# Patient Record
Sex: Female | Born: 1960 | Race: White | Hispanic: No | Marital: Single
Health system: Southern US, Community
[De-identification: ages and names within clinical notes are randomized; demographics above are authoritative.]

---

## 2005-02-22 ENCOUNTER — Ambulatory Visit: Payer: Self-pay | Admitting: Family Medicine

## 2006-09-22 ENCOUNTER — Ambulatory Visit (HOSPITAL_COMMUNITY): Admission: RE | Admit: 2006-09-22 | Discharge: 2006-09-22 | Payer: Self-pay | Admitting: Orthopedic Surgery

## 2006-10-10 ENCOUNTER — Ambulatory Visit (HOSPITAL_COMMUNITY): Admission: RE | Admit: 2006-10-10 | Discharge: 2006-10-10 | Payer: Self-pay | Admitting: Orthopedic Surgery

## 2008-06-21 ENCOUNTER — Encounter: Payer: Self-pay | Admitting: Endocrinology

## 2008-07-04 ENCOUNTER — Ambulatory Visit: Payer: Self-pay | Admitting: Endocrinology

## 2008-07-04 DIAGNOSIS — Z9189 Other specified personal risk factors, not elsewhere classified: Secondary | ICD-10-CM | POA: Insufficient documentation

## 2008-07-04 DIAGNOSIS — F3289 Other specified depressive episodes: Secondary | ICD-10-CM | POA: Insufficient documentation

## 2008-07-04 DIAGNOSIS — F329 Major depressive disorder, single episode, unspecified: Secondary | ICD-10-CM | POA: Insufficient documentation

## 2008-07-04 DIAGNOSIS — D649 Anemia, unspecified: Secondary | ICD-10-CM | POA: Insufficient documentation

## 2008-07-04 DIAGNOSIS — J309 Allergic rhinitis, unspecified: Secondary | ICD-10-CM | POA: Insufficient documentation

## 2008-07-04 DIAGNOSIS — I10 Essential (primary) hypertension: Secondary | ICD-10-CM | POA: Insufficient documentation

## 2008-07-04 DIAGNOSIS — Z87448 Personal history of other diseases of urinary system: Secondary | ICD-10-CM | POA: Insufficient documentation

## 2008-07-18 ENCOUNTER — Encounter (HOSPITAL_COMMUNITY): Admission: RE | Admit: 2008-07-18 | Discharge: 2008-09-22 | Payer: Self-pay | Admitting: Endocrinology

## 2008-07-26 ENCOUNTER — Encounter: Admission: RE | Admit: 2008-07-26 | Discharge: 2008-07-26 | Payer: Self-pay | Admitting: Endocrinology

## 2008-08-15 ENCOUNTER — Encounter (HOSPITAL_COMMUNITY): Admission: RE | Admit: 2008-08-15 | Discharge: 2008-09-20 | Payer: Self-pay | Admitting: Endocrinology

## 2008-10-17 ENCOUNTER — Ambulatory Visit: Payer: Self-pay | Admitting: Endocrinology

## 2008-10-17 DIAGNOSIS — E890 Postprocedural hypothyroidism: Secondary | ICD-10-CM | POA: Insufficient documentation

## 2008-10-17 DIAGNOSIS — E042 Nontoxic multinodular goiter: Secondary | ICD-10-CM | POA: Insufficient documentation

## 2008-10-19 ENCOUNTER — Telehealth (INDEPENDENT_AMBULATORY_CARE_PROVIDER_SITE_OTHER): Payer: Self-pay | Admitting: *Deleted

## 2008-11-15 ENCOUNTER — Ambulatory Visit: Payer: Self-pay | Admitting: Endocrinology

## 2008-11-15 LAB — CONVERTED CEMR LAB: TSH: 16.91 microintl units/mL — ABNORMAL HIGH (ref 0.35–5.50)

## 2009-01-09 ENCOUNTER — Ambulatory Visit: Payer: Self-pay | Admitting: Endocrinology

## 2009-01-09 LAB — CONVERTED CEMR LAB: TSH: 9.22 microintl units/mL — ABNORMAL HIGH (ref 0.35–5.50)

## 2009-04-03 ENCOUNTER — Ambulatory Visit: Payer: Self-pay | Admitting: Endocrinology

## 2009-04-06 ENCOUNTER — Telehealth: Payer: Self-pay | Admitting: Endocrinology

## 2009-04-10 ENCOUNTER — Encounter: Admission: RE | Admit: 2009-04-10 | Discharge: 2009-04-10 | Payer: Self-pay | Admitting: Endocrinology

## 2009-06-23 LAB — CONVERTED CEMR LAB

## 2009-10-16 ENCOUNTER — Ambulatory Visit: Payer: Self-pay | Admitting: Endocrinology

## 2010-04-13 IMAGING — US US SOFT TISSUE HEAD/NECK
1 series · 14 of 24 positions shown · non-contrast
Comparison: 07/19/2008 nuclear medicine thyroid scan.

CLINICAL DATA: Hyperthyroidism.  Possible multinodular goiter noted
on nuclear medicine thyroid scan.

THYROID ULTRASOUND
TECHNIQUE: Ultrasound examination of the thyroid gland and
adjacent soft tissues was performed.

[Series 1: us soft tissue head/neck · 0.08mm/px · 14 of 24 slices shown]
[im 1/24]
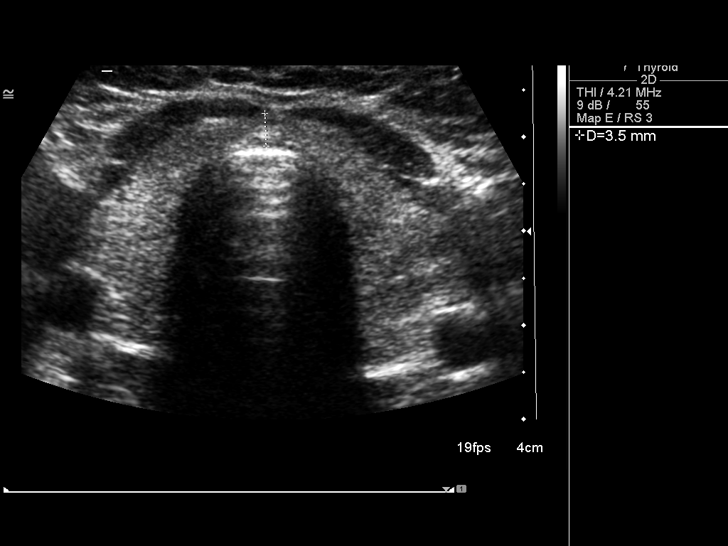
[im 3/24]
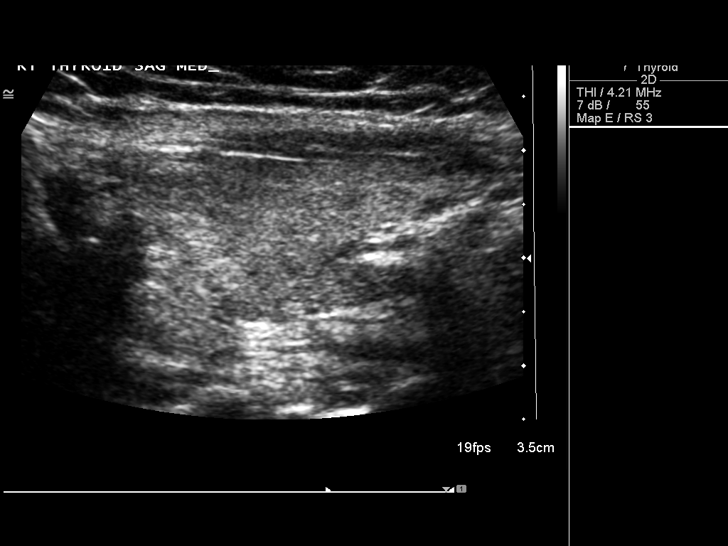
[im 5/24]
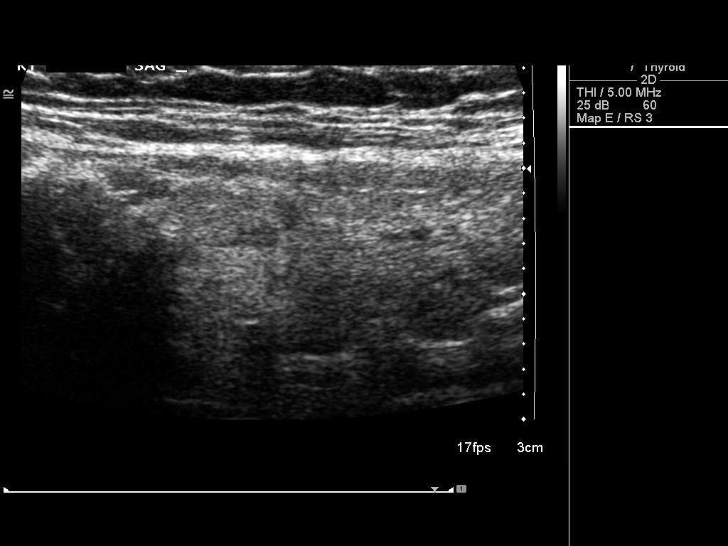
[im 7/24]
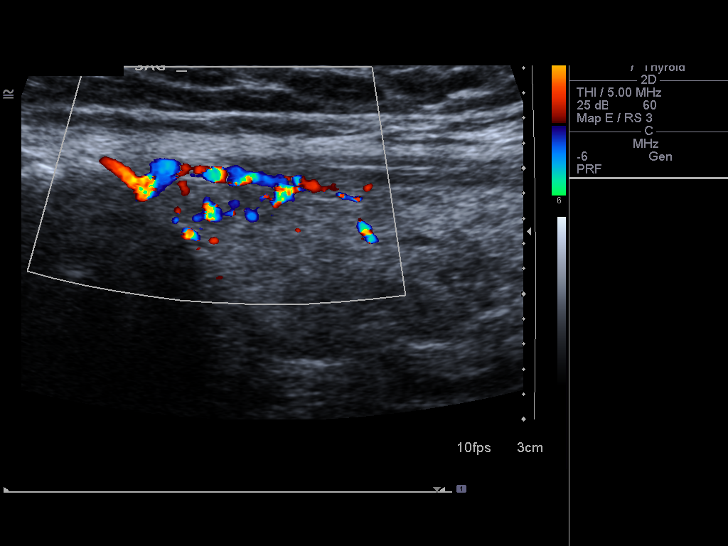
[im 8/24]
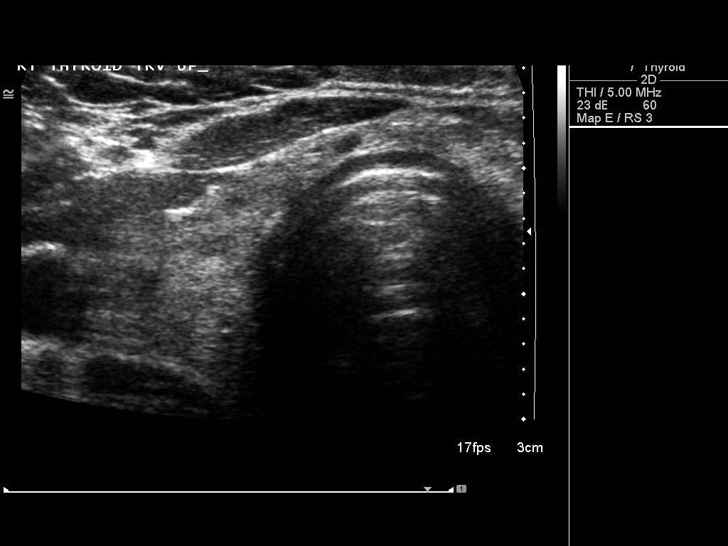
[im 10/24]
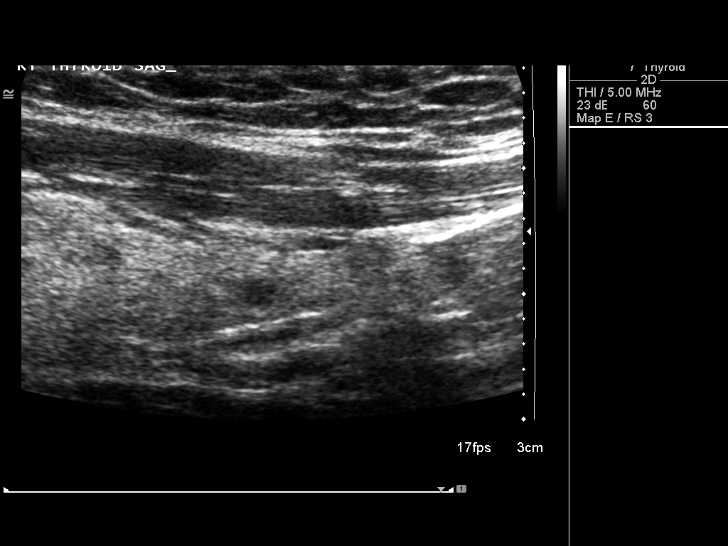
[im 12/24]
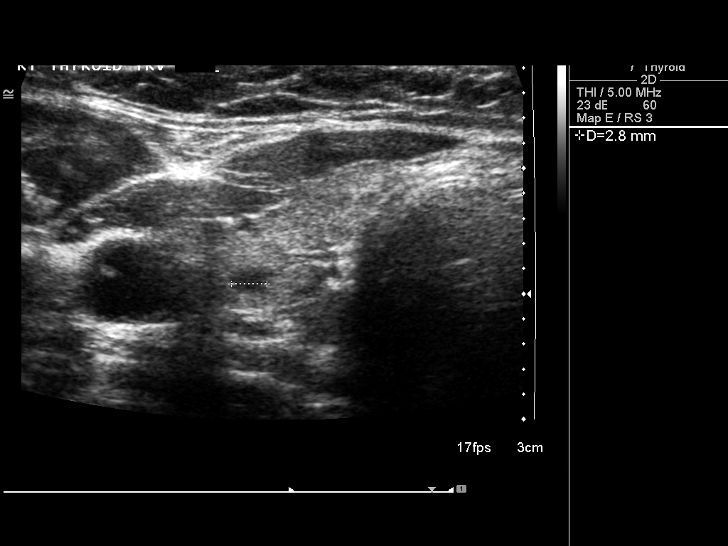
[im 13/24]
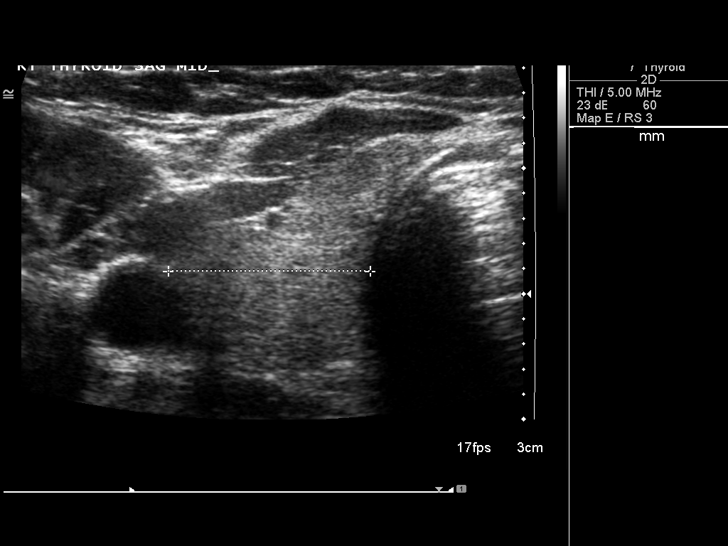
[im 15/24]
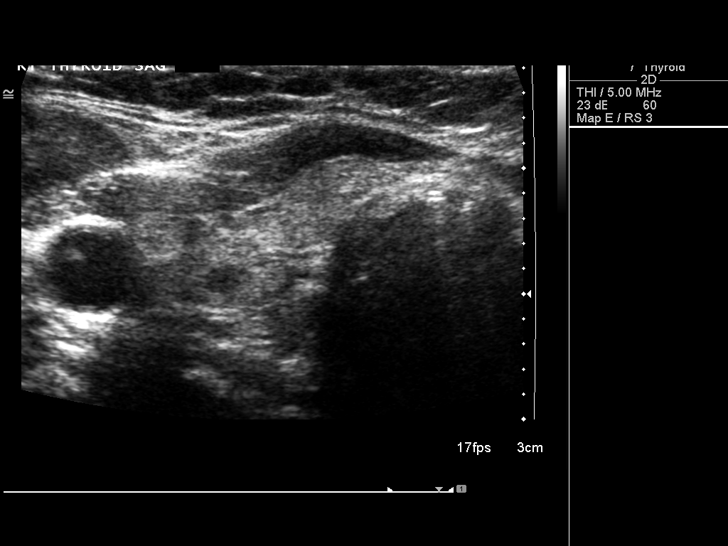
[im 17/24]
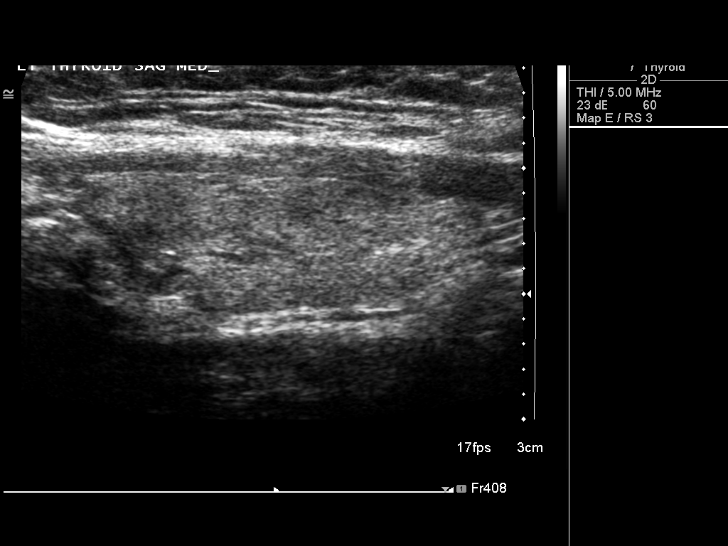
[im 19/24]
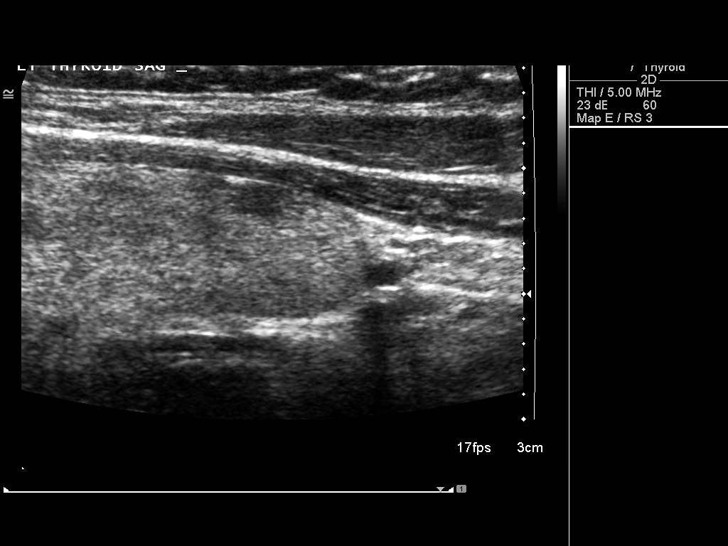
[im 20/24]
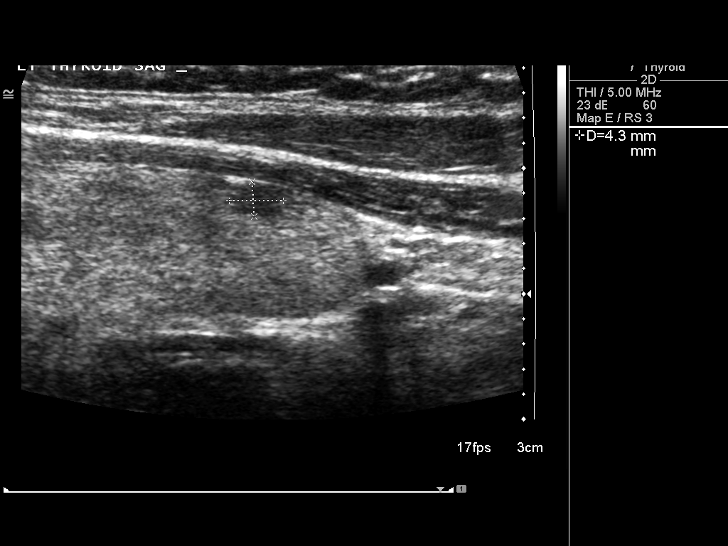
[im 22/24]
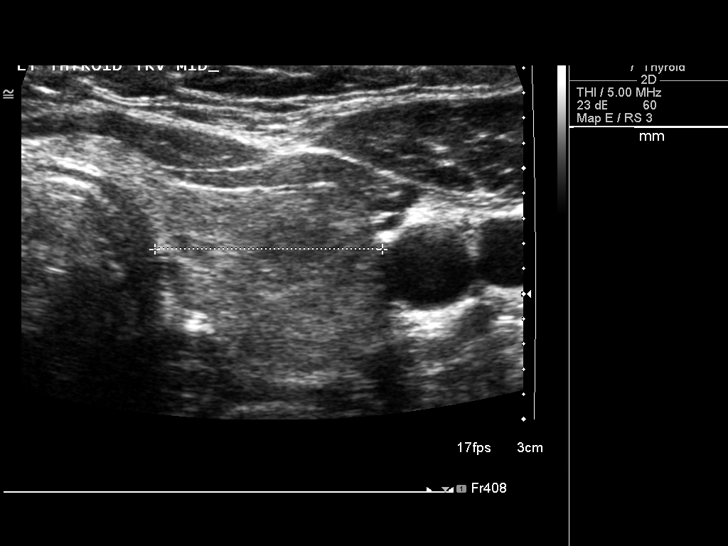
[im 24/24]
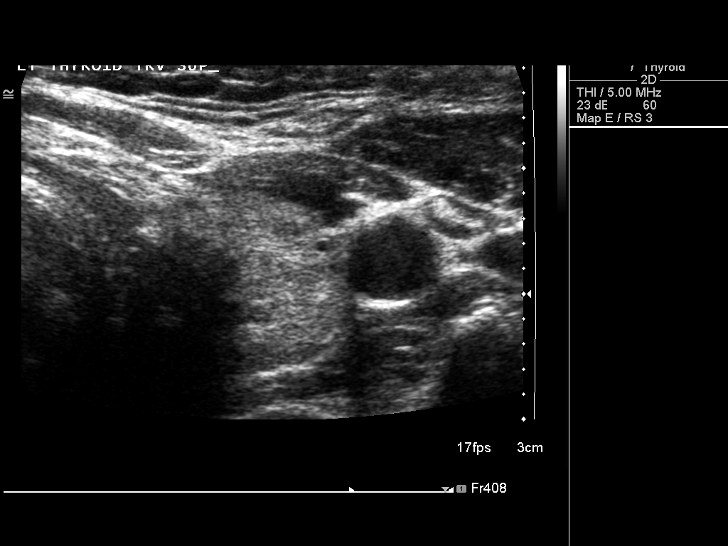

[14 of 24 positions shown; findings below may reference images not displayed]

FINDINGS: The right lobe of thyroid measures 4.2 x 1.4 x 1.6 cm in
size and the left lobe 4.0 x 1.6 x 1.8 cm in size.  There is a
x 0.5 x 1.0 cm in size solid nodule associated with the medial
upper pole of the right lobe of the thyroid.  Two small nodules are
seen within the lower poles of the right and left lobes of the
thyroid.  On the right, the nodule measures 0.3 x 0.3 x 0.3 cm in
size and on the left, 0.4 x 0.3 x 0.3 cm in size.  The thyroid
echotexture is inhomogeneous.
IMPRESSION: Multinodular gland as discussed above.  The largest nodule is
associated with the medial portion of the upper pole of the right
lobe thyroid and measures 1.1 x 0.5 of 1.0 cm in size.

## 2010-10-14 ENCOUNTER — Encounter: Payer: Self-pay | Admitting: Endocrinology

## 2010-10-23 NOTE — Assessment & Plan Note (Signed)
Summary: 6 MTH FU  $50  STC--RS BUMP/PHONE--MAILED APPT CARD PER PT/MO...   Vital Signs:  Patient profile:   50 year old female Height:      69 inches (175.26 cm) Weight:      253 pounds (115.00 kg) O2 Sat:      98 % on Room air Temp:     96.2 degrees F (35.67 degrees C) oral Pulse rate:   74 / minute BP sitting:   142 / 98  (left arm) Cuff size:   large  Vitals Entered By: Josph Macho CMA (October 16, 2009 8:21 AM)  O2 Flow:  Room air CC: 6 month follow up/ CF Is Patient Diabetic? No   Referring Provider:  schultz Primary Provider:  schultz  CC:  6 month follow up/ CF.  History of Present Illness: pt is now more than 1 year s/p i-131 rx for hyperthyroidism. her levothyroxine was increased to 200 micrograms/day, due to an abnormal tsh in dec 2010. she is discouraged by weight gain over the past few months. she does not notice her thyroid being enlarged.  Current Medications (verified): 1)  Levothyroxine Sodium 200 Mcg Tabs (Levothyroxine Sodium) .Marland Kitchen.. 1 Qd  Allergies (verified): No Known Drug Allergies  Past History:  Past Medical History: Last updated: 07/04/2008 Allergic rhinitis Anemia-NOS Depression Hypertension Hyperthyroidism  Review of Systems       The patient complains of weight gain.    Physical Exam  General:  obese.  no distress  Neck:  i do not appreciate a goiter or thyroid nodule. Additional Exam:  thyroid US (2010):  improved. 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 situational  Problem # 3:  GOITER, MULTINODULAR (ICD-241.1)  Medications Added to Medication List This Visit: 1)  Levothyroxine Sodium 200 Mcg Tabs (levothyroxine Sodium)  .Marland Kitchen.. 1 qd 2)  Levothyroxine Sodium 175 Mcg Tabs (Levothyroxine sodium) .Marland Kitchen.. 1 qd  Other Orders: TLB-TSH (Thyroid Stimulating Hormone)  (84443-TSH) Est. Patient Level IV (29528)  Patient Instructions: 1)  pending the test results, please continue the same medications for now (levothyroxine 200 mcg once daily). 2)  Please returrn here as needed. 3)  tests are being ordered for you today.  a few days after the test(s), please call [phone] to hear your test results.  this is very important to do because the results may change the instructions you see here. 4)  please have a bp recheck at [facility] 5)  (update: i left message on phone-tree:  reduce levothyroxine to 175 micrograms/day) 6)  we'll follow the thyroid ultraound periodically. Prescriptions: LEVOTHYROXINE SODIUM 175 MCG TABS (LEVOTHYROXINE SODIUM) 1 qd  #30 x 5   Entered and Authorized by:   Minus Breeding MD   Signed by:   Minus Breeding MD on 10/18/2009   Method used:   Electronically to        [facility]  [id number] (retail)       [address]       [place], [place]  [address]       Ph: [phone] or [phone]       Fax: [phone]   RxID:   [id number]   Preventive Care Screening  Mammogram:    Date:  07/24/2009    Results:  historical  Pap Smear:    Date:  06/23/2009    Results:  historical

## 2010-12-27 IMAGING — US US SOFT TISSUE HEAD/NECK
1 series · 14 of 25 positions shown · non-contrast
Comparison: 07/26/2008

CLINICAL DATA: Multinodular goiter

THYROID ULTRASOUND
TECHNIQUE: Ultrasound examination of the thyroid gland and
adjacent soft tissues was performed.

[Series 1: us soft tissue head/neck · 0.05mm/px · 14 of 34 slices shown]
[im 1/34]
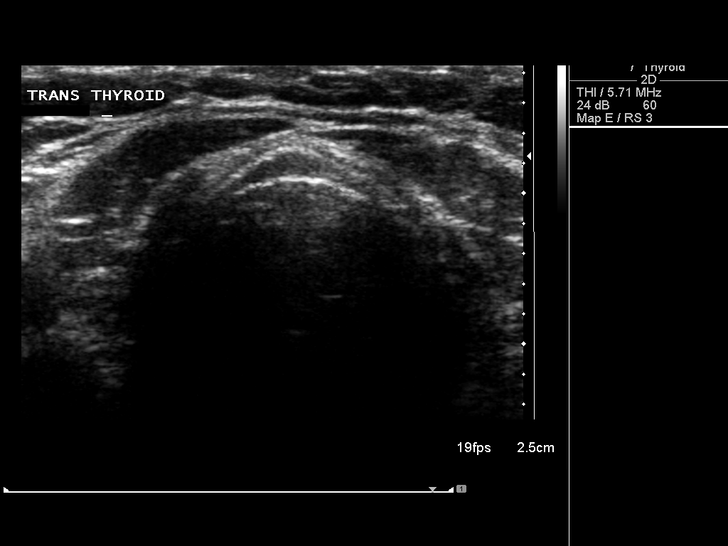
[im 3/34]
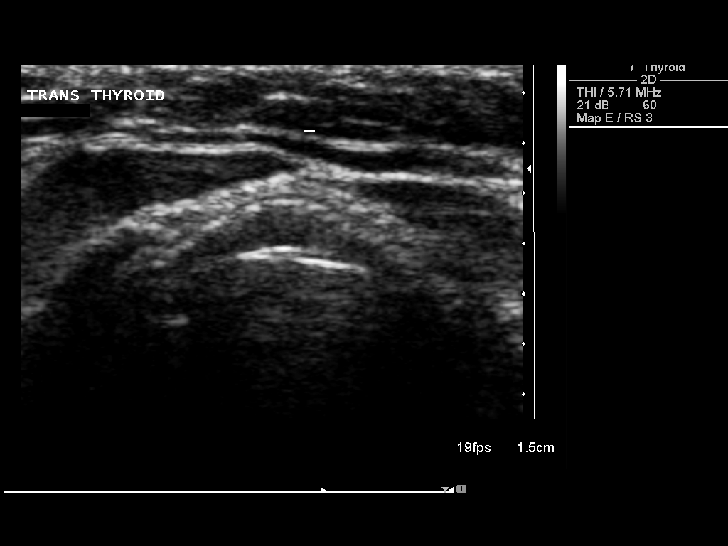
[im 6/34]
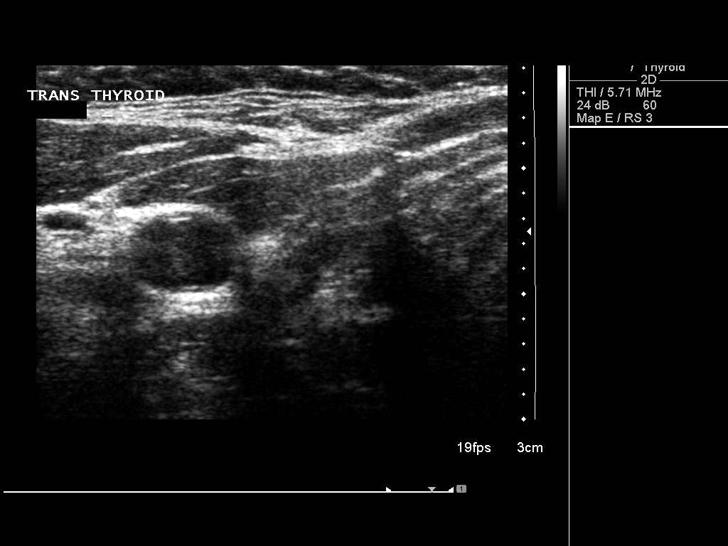
[im 9/34]
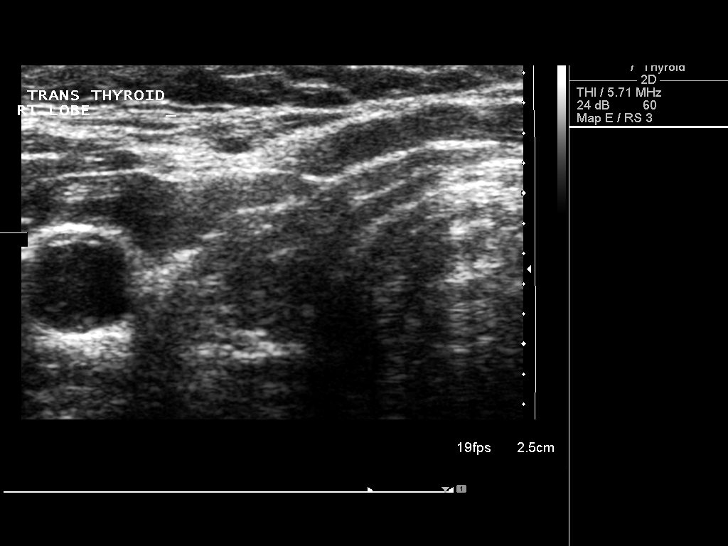
[im 12/34]
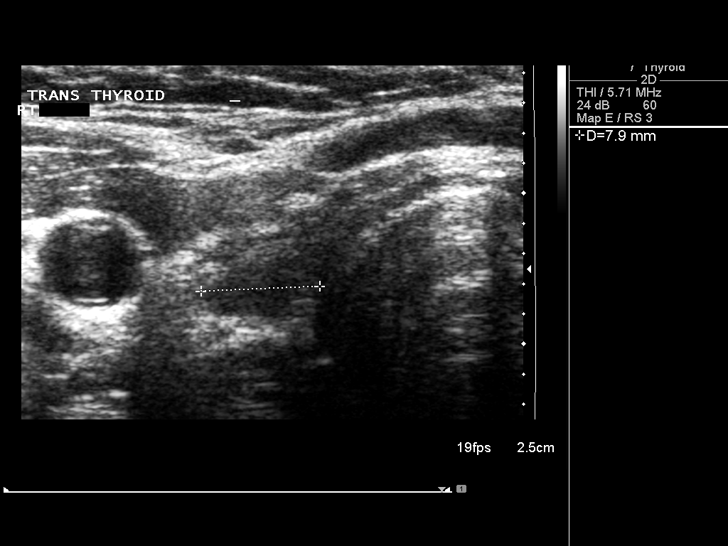
[im 13/34]
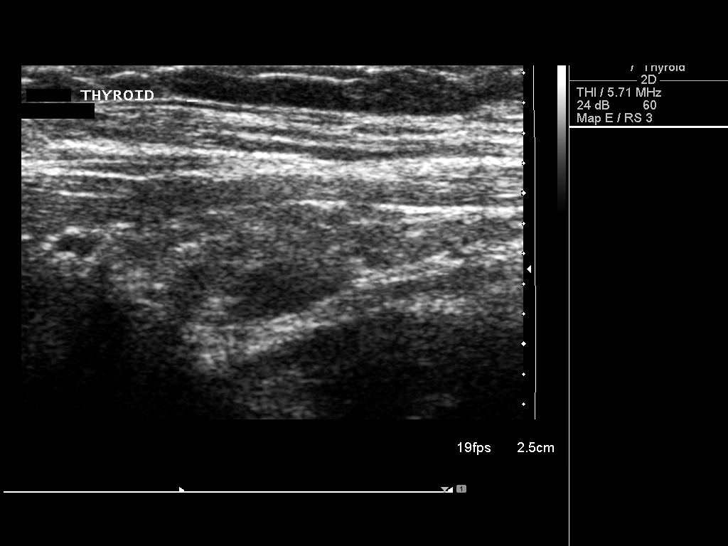
[im 16/34]
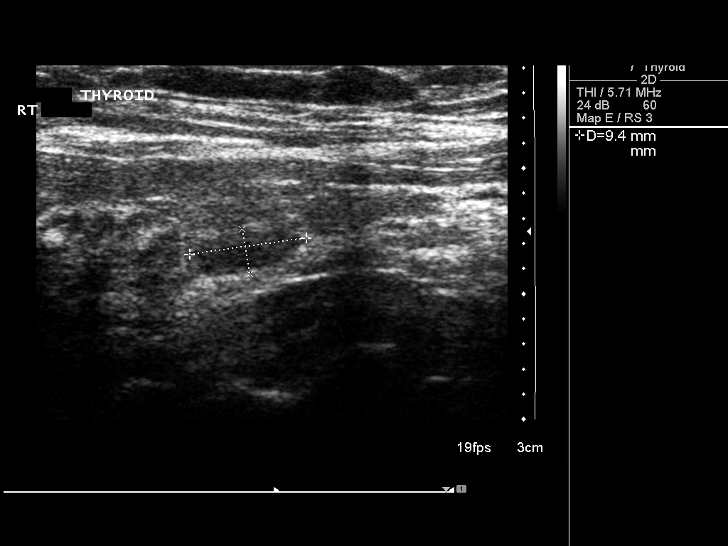
[im 18/34]
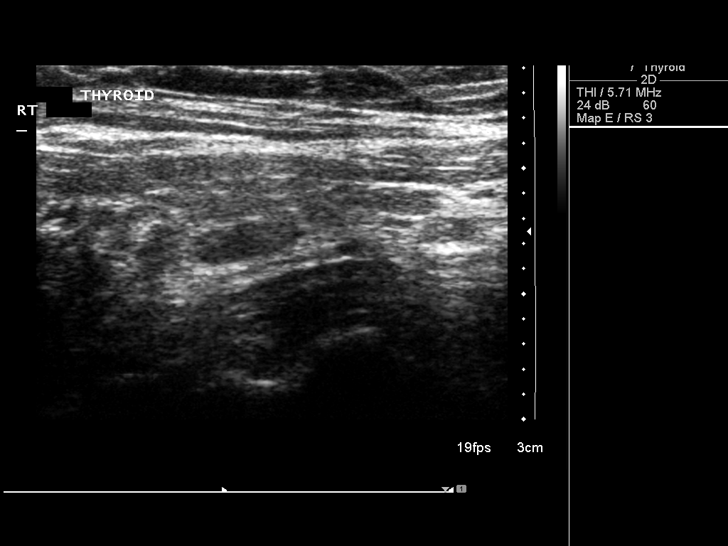
[im 21/34]
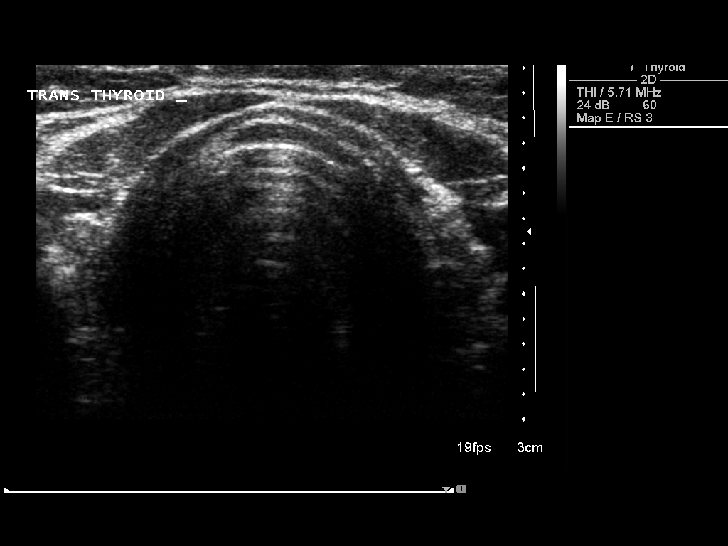
[im 23/34]
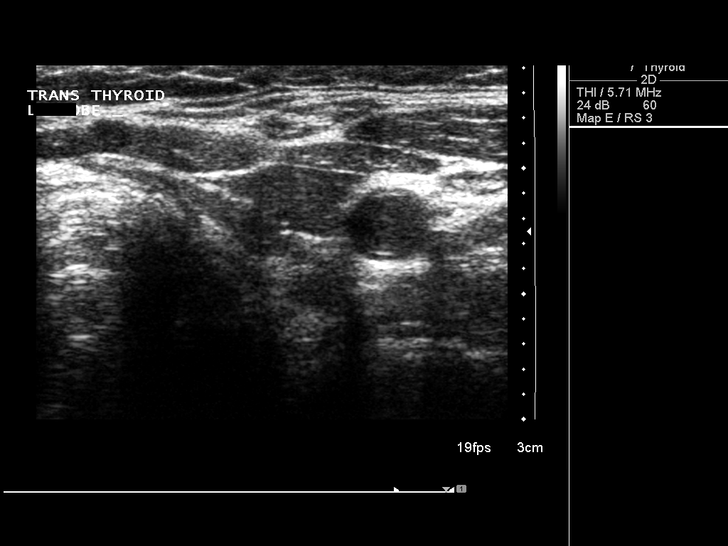
[im 25/34]
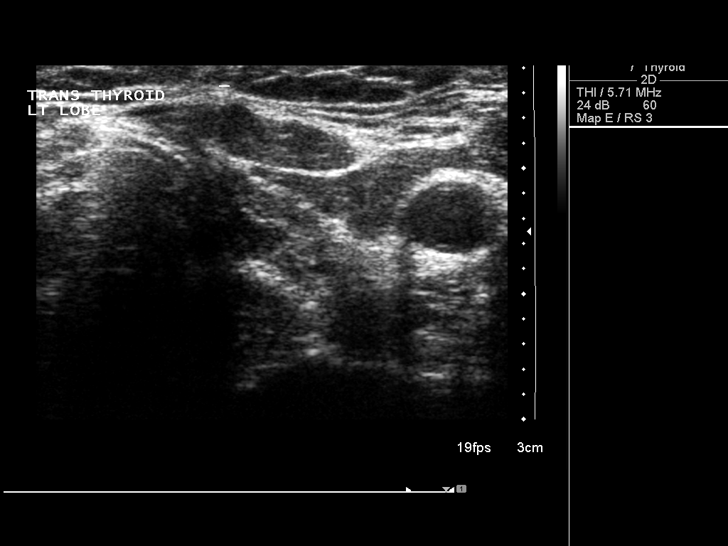
[im 28/34]
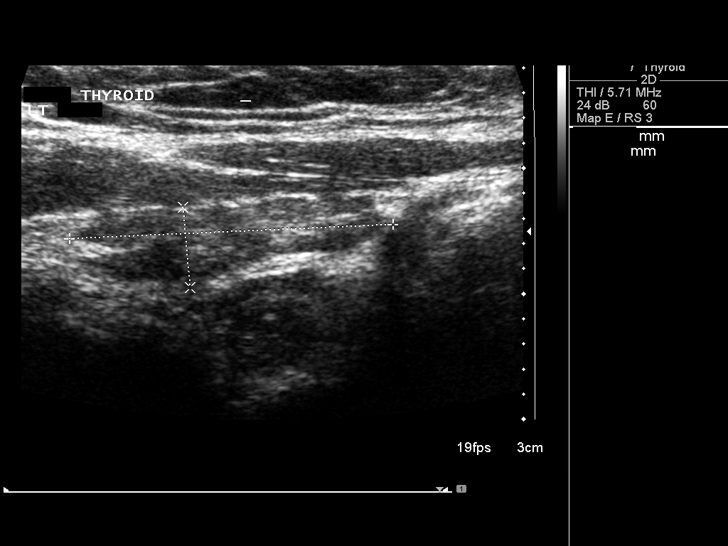
[im 31/34]
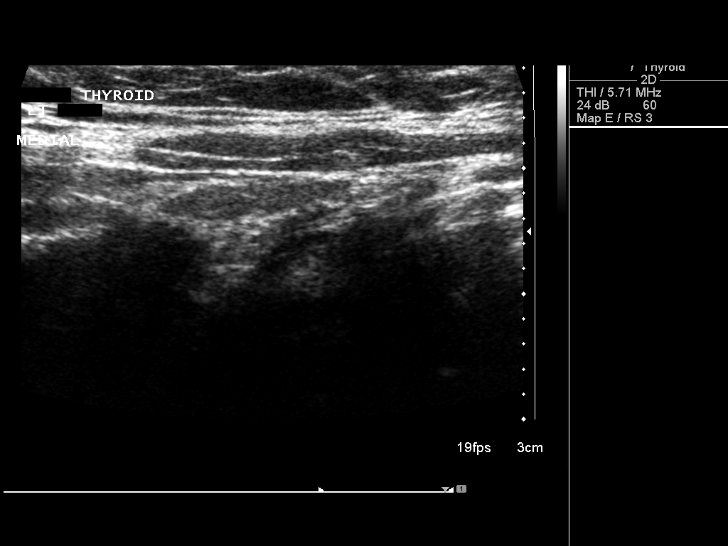
[im 34/34]
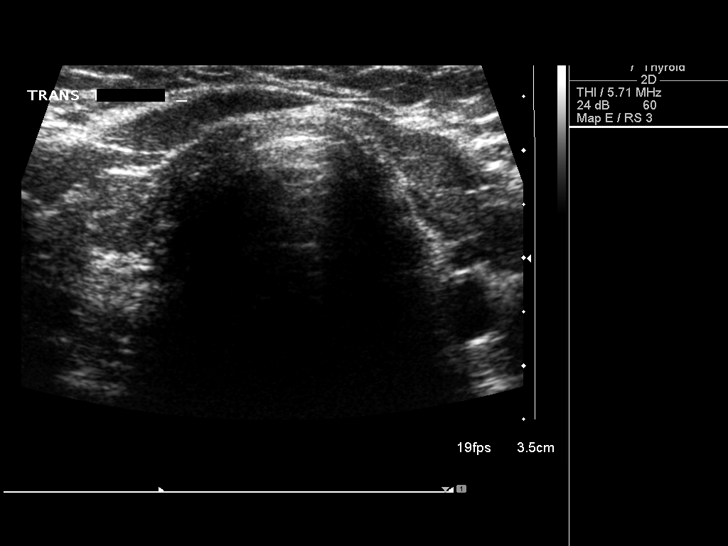

[14 of 25 positions shown; findings below may reference images not displayed]

FINDINGS: Right thyroid lobe 2.9 cm length by 0.9 cm AP by 1.1 cm transverse.
Left thyroid lobe 2.6 cm length by 0.6 cm AP by 0.9 cm transverse.
Thyroid isthmus 1.4 mm thick.
Diffusely inhomogeneous thyroid echogenicity bilaterally.
Single discrete nodule identified right lobe 9 x 4 x 8 mm,
hypoechoic.
No other definite thyroid nodules are detected.
Both thyroid lobes have decreased in size since previous exam.
In addition, additional small nodules seen in both thyroid lobes on
previous study are no longer identified.
These may be the result of interval radioactive iodine therapy for
hyperthyroidism.
IMPRESSION: Single tiny 9 mm right thyroid nodule, with decrease in number and
size of thyroid nodules as well as overall decrease in size of
thyroid gland since previous study, suspect related to interval [place]
131 therapy.

## 2016-07-08 DIAGNOSIS — Z0000 Encounter for general adult medical examination without abnormal findings: Secondary | ICD-10-CM | POA: Diagnosis not present

## 2016-07-08 DIAGNOSIS — E890 Postprocedural hypothyroidism: Secondary | ICD-10-CM | POA: Diagnosis not present

## 2016-07-08 DIAGNOSIS — Z1389 Encounter for screening for other disorder: Secondary | ICD-10-CM | POA: Diagnosis not present

## 2016-07-08 DIAGNOSIS — E785 Hyperlipidemia, unspecified: Secondary | ICD-10-CM | POA: Diagnosis not present

## 2016-08-05 DIAGNOSIS — Z1231 Encounter for screening mammogram for malignant neoplasm of breast: Secondary | ICD-10-CM | POA: Diagnosis not present

## 2016-09-10 DIAGNOSIS — Z1211 Encounter for screening for malignant neoplasm of colon: Secondary | ICD-10-CM | POA: Diagnosis not present

## 2016-09-10 DIAGNOSIS — Z86010 Personal history of colonic polyps: Secondary | ICD-10-CM | POA: Diagnosis not present

## 2016-10-23 DIAGNOSIS — Z79899 Other long term (current) drug therapy: Secondary | ICD-10-CM | POA: Diagnosis not present

## 2016-10-23 DIAGNOSIS — K634 Enteroptosis: Secondary | ICD-10-CM | POA: Diagnosis not present

## 2016-10-23 DIAGNOSIS — Z86010 Personal history of colonic polyps: Secondary | ICD-10-CM | POA: Diagnosis not present

## 2016-10-23 DIAGNOSIS — Z1211 Encounter for screening for malignant neoplasm of colon: Secondary | ICD-10-CM | POA: Diagnosis not present

## 2016-10-23 DIAGNOSIS — Z87891 Personal history of nicotine dependence: Secondary | ICD-10-CM | POA: Diagnosis not present

## 2016-10-23 DIAGNOSIS — K635 Polyp of colon: Secondary | ICD-10-CM | POA: Diagnosis not present

## 2016-10-23 DIAGNOSIS — E785 Hyperlipidemia, unspecified: Secondary | ICD-10-CM | POA: Diagnosis not present

## 2016-10-23 DIAGNOSIS — E559 Vitamin D deficiency, unspecified: Secondary | ICD-10-CM | POA: Diagnosis not present

## 2016-10-23 DIAGNOSIS — D124 Benign neoplasm of descending colon: Secondary | ICD-10-CM | POA: Diagnosis not present

## 2016-10-23 DIAGNOSIS — E039 Hypothyroidism, unspecified: Secondary | ICD-10-CM | POA: Diagnosis not present

## 2016-10-23 DIAGNOSIS — K5730 Diverticulosis of large intestine without perforation or abscess without bleeding: Secondary | ICD-10-CM | POA: Diagnosis not present

## 2017-01-07 DIAGNOSIS — E559 Vitamin D deficiency, unspecified: Secondary | ICD-10-CM | POA: Diagnosis not present

## 2017-01-07 DIAGNOSIS — E890 Postprocedural hypothyroidism: Secondary | ICD-10-CM | POA: Diagnosis not present

## 2017-01-07 DIAGNOSIS — E785 Hyperlipidemia, unspecified: Secondary | ICD-10-CM | POA: Diagnosis not present

## 2017-02-11 DIAGNOSIS — M9903 Segmental and somatic dysfunction of lumbar region: Secondary | ICD-10-CM | POA: Diagnosis not present

## 2017-02-11 DIAGNOSIS — M9905 Segmental and somatic dysfunction of pelvic region: Secondary | ICD-10-CM | POA: Diagnosis not present

## 2017-02-11 DIAGNOSIS — M545 Low back pain: Secondary | ICD-10-CM | POA: Diagnosis not present

## 2017-02-11 DIAGNOSIS — M4316 Spondylolisthesis, lumbar region: Secondary | ICD-10-CM | POA: Diagnosis not present

## 2017-02-18 DIAGNOSIS — M9905 Segmental and somatic dysfunction of pelvic region: Secondary | ICD-10-CM | POA: Diagnosis not present

## 2017-02-18 DIAGNOSIS — M9903 Segmental and somatic dysfunction of lumbar region: Secondary | ICD-10-CM | POA: Diagnosis not present

## 2017-02-18 DIAGNOSIS — M4316 Spondylolisthesis, lumbar region: Secondary | ICD-10-CM | POA: Diagnosis not present

## 2017-02-18 DIAGNOSIS — M545 Low back pain: Secondary | ICD-10-CM | POA: Diagnosis not present

## 2017-02-19 DIAGNOSIS — M9905 Segmental and somatic dysfunction of pelvic region: Secondary | ICD-10-CM | POA: Diagnosis not present

## 2017-02-19 DIAGNOSIS — M4316 Spondylolisthesis, lumbar region: Secondary | ICD-10-CM | POA: Diagnosis not present

## 2017-02-19 DIAGNOSIS — M9903 Segmental and somatic dysfunction of lumbar region: Secondary | ICD-10-CM | POA: Diagnosis not present

## 2017-02-19 DIAGNOSIS — M545 Low back pain: Secondary | ICD-10-CM | POA: Diagnosis not present

## 2017-02-24 DIAGNOSIS — M9905 Segmental and somatic dysfunction of pelvic region: Secondary | ICD-10-CM | POA: Diagnosis not present

## 2017-02-24 DIAGNOSIS — M545 Low back pain: Secondary | ICD-10-CM | POA: Diagnosis not present

## 2017-02-24 DIAGNOSIS — M9903 Segmental and somatic dysfunction of lumbar region: Secondary | ICD-10-CM | POA: Diagnosis not present

## 2017-02-24 DIAGNOSIS — M4316 Spondylolisthesis, lumbar region: Secondary | ICD-10-CM | POA: Diagnosis not present

## 2017-02-25 DIAGNOSIS — M9905 Segmental and somatic dysfunction of pelvic region: Secondary | ICD-10-CM | POA: Diagnosis not present

## 2017-02-25 DIAGNOSIS — M4316 Spondylolisthesis, lumbar region: Secondary | ICD-10-CM | POA: Diagnosis not present

## 2017-02-25 DIAGNOSIS — M9903 Segmental and somatic dysfunction of lumbar region: Secondary | ICD-10-CM | POA: Diagnosis not present

## 2017-02-25 DIAGNOSIS — M545 Low back pain: Secondary | ICD-10-CM | POA: Diagnosis not present

## 2017-02-26 DIAGNOSIS — M545 Low back pain: Secondary | ICD-10-CM | POA: Diagnosis not present

## 2017-02-26 DIAGNOSIS — M4316 Spondylolisthesis, lumbar region: Secondary | ICD-10-CM | POA: Diagnosis not present

## 2017-02-26 DIAGNOSIS — M9903 Segmental and somatic dysfunction of lumbar region: Secondary | ICD-10-CM | POA: Diagnosis not present

## 2017-02-26 DIAGNOSIS — M9905 Segmental and somatic dysfunction of pelvic region: Secondary | ICD-10-CM | POA: Diagnosis not present

## 2017-03-03 DIAGNOSIS — M4316 Spondylolisthesis, lumbar region: Secondary | ICD-10-CM | POA: Diagnosis not present

## 2017-03-03 DIAGNOSIS — M9903 Segmental and somatic dysfunction of lumbar region: Secondary | ICD-10-CM | POA: Diagnosis not present

## 2017-03-03 DIAGNOSIS — M9905 Segmental and somatic dysfunction of pelvic region: Secondary | ICD-10-CM | POA: Diagnosis not present

## 2017-03-03 DIAGNOSIS — M545 Low back pain: Secondary | ICD-10-CM | POA: Diagnosis not present

## 2017-03-04 DIAGNOSIS — M545 Low back pain: Secondary | ICD-10-CM | POA: Diagnosis not present

## 2017-03-04 DIAGNOSIS — M9903 Segmental and somatic dysfunction of lumbar region: Secondary | ICD-10-CM | POA: Diagnosis not present

## 2017-03-04 DIAGNOSIS — M4316 Spondylolisthesis, lumbar region: Secondary | ICD-10-CM | POA: Diagnosis not present

## 2017-03-04 DIAGNOSIS — M9905 Segmental and somatic dysfunction of pelvic region: Secondary | ICD-10-CM | POA: Diagnosis not present

## 2017-03-05 DIAGNOSIS — M4316 Spondylolisthesis, lumbar region: Secondary | ICD-10-CM | POA: Diagnosis not present

## 2017-03-05 DIAGNOSIS — M545 Low back pain: Secondary | ICD-10-CM | POA: Diagnosis not present

## 2017-03-05 DIAGNOSIS — M9905 Segmental and somatic dysfunction of pelvic region: Secondary | ICD-10-CM | POA: Diagnosis not present

## 2017-03-05 DIAGNOSIS — M9903 Segmental and somatic dysfunction of lumbar region: Secondary | ICD-10-CM | POA: Diagnosis not present

## 2017-03-10 DIAGNOSIS — M545 Low back pain: Secondary | ICD-10-CM | POA: Diagnosis not present

## 2017-03-10 DIAGNOSIS — M9903 Segmental and somatic dysfunction of lumbar region: Secondary | ICD-10-CM | POA: Diagnosis not present

## 2017-03-10 DIAGNOSIS — M9905 Segmental and somatic dysfunction of pelvic region: Secondary | ICD-10-CM | POA: Diagnosis not present

## 2017-03-10 DIAGNOSIS — M4316 Spondylolisthesis, lumbar region: Secondary | ICD-10-CM | POA: Diagnosis not present

## 2017-03-11 DIAGNOSIS — M4316 Spondylolisthesis, lumbar region: Secondary | ICD-10-CM | POA: Diagnosis not present

## 2017-03-11 DIAGNOSIS — M9903 Segmental and somatic dysfunction of lumbar region: Secondary | ICD-10-CM | POA: Diagnosis not present

## 2017-03-11 DIAGNOSIS — M545 Low back pain: Secondary | ICD-10-CM | POA: Diagnosis not present

## 2017-03-11 DIAGNOSIS — M9905 Segmental and somatic dysfunction of pelvic region: Secondary | ICD-10-CM | POA: Diagnosis not present

## 2017-03-17 DIAGNOSIS — M9905 Segmental and somatic dysfunction of pelvic region: Secondary | ICD-10-CM | POA: Diagnosis not present

## 2017-03-17 DIAGNOSIS — M9903 Segmental and somatic dysfunction of lumbar region: Secondary | ICD-10-CM | POA: Diagnosis not present

## 2017-03-17 DIAGNOSIS — M545 Low back pain: Secondary | ICD-10-CM | POA: Diagnosis not present

## 2017-03-17 DIAGNOSIS — M4316 Spondylolisthesis, lumbar region: Secondary | ICD-10-CM | POA: Diagnosis not present

## 2017-03-24 DIAGNOSIS — M545 Low back pain: Secondary | ICD-10-CM | POA: Diagnosis not present

## 2017-03-24 DIAGNOSIS — M9903 Segmental and somatic dysfunction of lumbar region: Secondary | ICD-10-CM | POA: Diagnosis not present

## 2017-03-24 DIAGNOSIS — M4316 Spondylolisthesis, lumbar region: Secondary | ICD-10-CM | POA: Diagnosis not present

## 2017-03-24 DIAGNOSIS — M9905 Segmental and somatic dysfunction of pelvic region: Secondary | ICD-10-CM | POA: Diagnosis not present

## 2017-03-31 DIAGNOSIS — M9903 Segmental and somatic dysfunction of lumbar region: Secondary | ICD-10-CM | POA: Diagnosis not present

## 2017-03-31 DIAGNOSIS — M9905 Segmental and somatic dysfunction of pelvic region: Secondary | ICD-10-CM | POA: Diagnosis not present

## 2017-03-31 DIAGNOSIS — M4316 Spondylolisthesis, lumbar region: Secondary | ICD-10-CM | POA: Diagnosis not present

## 2017-03-31 DIAGNOSIS — M545 Low back pain: Secondary | ICD-10-CM | POA: Diagnosis not present

## 2017-04-16 DIAGNOSIS — M9905 Segmental and somatic dysfunction of pelvic region: Secondary | ICD-10-CM | POA: Diagnosis not present

## 2017-04-16 DIAGNOSIS — M545 Low back pain: Secondary | ICD-10-CM | POA: Diagnosis not present

## 2017-04-16 DIAGNOSIS — M4316 Spondylolisthesis, lumbar region: Secondary | ICD-10-CM | POA: Diagnosis not present

## 2017-04-16 DIAGNOSIS — M9903 Segmental and somatic dysfunction of lumbar region: Secondary | ICD-10-CM | POA: Diagnosis not present

## 2017-05-05 DIAGNOSIS — M9902 Segmental and somatic dysfunction of thoracic region: Secondary | ICD-10-CM | POA: Diagnosis not present

## 2017-05-05 DIAGNOSIS — M9903 Segmental and somatic dysfunction of lumbar region: Secondary | ICD-10-CM | POA: Diagnosis not present

## 2017-05-05 DIAGNOSIS — M9905 Segmental and somatic dysfunction of pelvic region: Secondary | ICD-10-CM | POA: Diagnosis not present

## 2017-05-05 DIAGNOSIS — M4316 Spondylolisthesis, lumbar region: Secondary | ICD-10-CM | POA: Diagnosis not present

## 2017-07-15 DIAGNOSIS — Z1231 Encounter for screening mammogram for malignant neoplasm of breast: Secondary | ICD-10-CM | POA: Diagnosis not present

## 2017-07-15 DIAGNOSIS — Z2821 Immunization not carried out because of patient refusal: Secondary | ICD-10-CM | POA: Diagnosis not present

## 2017-07-15 DIAGNOSIS — Z0000 Encounter for general adult medical examination without abnormal findings: Secondary | ICD-10-CM | POA: Diagnosis not present

## 2017-07-15 DIAGNOSIS — E669 Obesity, unspecified: Secondary | ICD-10-CM | POA: Diagnosis not present

## 2017-07-15 DIAGNOSIS — Z1389 Encounter for screening for other disorder: Secondary | ICD-10-CM | POA: Diagnosis not present

## 2017-07-15 DIAGNOSIS — E559 Vitamin D deficiency, unspecified: Secondary | ICD-10-CM | POA: Diagnosis not present

## 2017-08-18 DIAGNOSIS — M8589 Other specified disorders of bone density and structure, multiple sites: Secondary | ICD-10-CM | POA: Diagnosis not present

## 2017-08-18 DIAGNOSIS — M85852 Other specified disorders of bone density and structure, left thigh: Secondary | ICD-10-CM | POA: Diagnosis not present

## 2017-08-18 DIAGNOSIS — Z1231 Encounter for screening mammogram for malignant neoplasm of breast: Secondary | ICD-10-CM | POA: Diagnosis not present

## 2018-03-10 DIAGNOSIS — E890 Postprocedural hypothyroidism: Secondary | ICD-10-CM | POA: Diagnosis not present

## 2018-03-10 DIAGNOSIS — E785 Hyperlipidemia, unspecified: Secondary | ICD-10-CM | POA: Diagnosis not present

## 2018-10-27 DIAGNOSIS — M4316 Spondylolisthesis, lumbar region: Secondary | ICD-10-CM | POA: Diagnosis not present

## 2018-10-27 DIAGNOSIS — M9902 Segmental and somatic dysfunction of thoracic region: Secondary | ICD-10-CM | POA: Diagnosis not present

## 2018-10-27 DIAGNOSIS — M9903 Segmental and somatic dysfunction of lumbar region: Secondary | ICD-10-CM | POA: Diagnosis not present

## 2018-10-27 DIAGNOSIS — M9905 Segmental and somatic dysfunction of pelvic region: Secondary | ICD-10-CM | POA: Diagnosis not present

## 2019-06-22 DIAGNOSIS — M65332 Trigger finger, left middle finger: Secondary | ICD-10-CM | POA: Diagnosis not present

## 2019-06-22 DIAGNOSIS — M79642 Pain in left hand: Secondary | ICD-10-CM | POA: Diagnosis not present

## 2019-06-22 DIAGNOSIS — M65342 Trigger finger, left ring finger: Secondary | ICD-10-CM | POA: Diagnosis not present

## 2019-06-28 DIAGNOSIS — Z2821 Immunization not carried out because of patient refusal: Secondary | ICD-10-CM | POA: Diagnosis not present

## 2019-06-28 DIAGNOSIS — E559 Vitamin D deficiency, unspecified: Secondary | ICD-10-CM | POA: Diagnosis not present

## 2019-06-28 DIAGNOSIS — Z0000 Encounter for general adult medical examination without abnormal findings: Secondary | ICD-10-CM | POA: Diagnosis not present

## 2019-06-28 DIAGNOSIS — E785 Hyperlipidemia, unspecified: Secondary | ICD-10-CM | POA: Diagnosis not present

## 2019-06-28 DIAGNOSIS — E669 Obesity, unspecified: Secondary | ICD-10-CM | POA: Diagnosis not present

## 2019-06-28 DIAGNOSIS — Z1331 Encounter for screening for depression: Secondary | ICD-10-CM | POA: Diagnosis not present

## 2019-07-20 DIAGNOSIS — M65332 Trigger finger, left middle finger: Secondary | ICD-10-CM | POA: Diagnosis not present

## 2019-07-20 DIAGNOSIS — M65342 Trigger finger, left ring finger: Secondary | ICD-10-CM | POA: Diagnosis not present

## 2019-09-29 DIAGNOSIS — M85852 Other specified disorders of bone density and structure, left thigh: Secondary | ICD-10-CM | POA: Diagnosis not present

## 2019-09-29 DIAGNOSIS — Z1231 Encounter for screening mammogram for malignant neoplasm of breast: Secondary | ICD-10-CM | POA: Diagnosis not present

## 2019-09-29 DIAGNOSIS — M8589 Other specified disorders of bone density and structure, multiple sites: Secondary | ICD-10-CM | POA: Diagnosis not present

## 2019-11-09 DIAGNOSIS — M65332 Trigger finger, left middle finger: Secondary | ICD-10-CM | POA: Diagnosis not present

## 2019-11-09 DIAGNOSIS — M65342 Trigger finger, left ring finger: Secondary | ICD-10-CM | POA: Diagnosis not present

## 2019-12-14 DIAGNOSIS — M65342 Trigger finger, left ring finger: Secondary | ICD-10-CM | POA: Diagnosis not present

## 2019-12-14 DIAGNOSIS — M65332 Trigger finger, left middle finger: Secondary | ICD-10-CM | POA: Diagnosis not present

## 2020-01-18 DIAGNOSIS — Z6837 Body mass index (BMI) 37.0-37.9, adult: Secondary | ICD-10-CM | POA: Diagnosis not present

## 2020-01-18 DIAGNOSIS — R7303 Prediabetes: Secondary | ICD-10-CM | POA: Diagnosis not present

## 2020-01-18 DIAGNOSIS — E890 Postprocedural hypothyroidism: Secondary | ICD-10-CM | POA: Diagnosis not present

## 2020-01-18 DIAGNOSIS — E785 Hyperlipidemia, unspecified: Secondary | ICD-10-CM | POA: Diagnosis not present

## 2020-01-18 DIAGNOSIS — E559 Vitamin D deficiency, unspecified: Secondary | ICD-10-CM | POA: Diagnosis not present

## 2020-03-13 DIAGNOSIS — E890 Postprocedural hypothyroidism: Secondary | ICD-10-CM | POA: Diagnosis not present

## 2020-03-13 DIAGNOSIS — Z6837 Body mass index (BMI) 37.0-37.9, adult: Secondary | ICD-10-CM | POA: Diagnosis not present

## 2021-07-31 DIAGNOSIS — M65332 Trigger finger, left middle finger: Secondary | ICD-10-CM | POA: Diagnosis not present

## 2021-07-31 DIAGNOSIS — M65342 Trigger finger, left ring finger: Secondary | ICD-10-CM | POA: Diagnosis not present

## 2021-08-13 DIAGNOSIS — E890 Postprocedural hypothyroidism: Secondary | ICD-10-CM | POA: Diagnosis not present

## 2021-10-23 DIAGNOSIS — E890 Postprocedural hypothyroidism: Secondary | ICD-10-CM | POA: Diagnosis not present

## 2021-10-23 DIAGNOSIS — Z720 Tobacco use: Secondary | ICD-10-CM | POA: Diagnosis not present

## 2021-10-23 DIAGNOSIS — E785 Hyperlipidemia, unspecified: Secondary | ICD-10-CM | POA: Diagnosis not present

## 2021-10-23 DIAGNOSIS — E559 Vitamin D deficiency, unspecified: Secondary | ICD-10-CM | POA: Diagnosis not present

## 2021-10-23 DIAGNOSIS — Z6838 Body mass index (BMI) 38.0-38.9, adult: Secondary | ICD-10-CM | POA: Diagnosis not present

## 2021-10-23 DIAGNOSIS — R7303 Prediabetes: Secondary | ICD-10-CM | POA: Diagnosis not present

## 2021-11-29 ENCOUNTER — Telehealth: Payer: Self-pay

## 2021-11-29 NOTE — Telephone Encounter (Signed)
I have called and left a voicemail for patient to call back and schedule an appointment with previsit for a recall colonoscopy. ?

## 2021-12-04 DIAGNOSIS — Z0000 Encounter for general adult medical examination without abnormal findings: Secondary | ICD-10-CM | POA: Diagnosis not present

## 2021-12-04 DIAGNOSIS — Z6837 Body mass index (BMI) 37.0-37.9, adult: Secondary | ICD-10-CM | POA: Diagnosis not present

## 2022-03-12 DIAGNOSIS — Z1231 Encounter for screening mammogram for malignant neoplasm of breast: Secondary | ICD-10-CM | POA: Diagnosis not present

## 2022-03-12 DIAGNOSIS — E669 Obesity, unspecified: Secondary | ICD-10-CM | POA: Diagnosis not present

## 2022-03-12 DIAGNOSIS — M8589 Other specified disorders of bone density and structure, multiple sites: Secondary | ICD-10-CM | POA: Diagnosis not present

## 2022-03-12 DIAGNOSIS — R7303 Prediabetes: Secondary | ICD-10-CM | POA: Diagnosis not present

## 2022-03-12 DIAGNOSIS — E785 Hyperlipidemia, unspecified: Secondary | ICD-10-CM | POA: Diagnosis not present

## 2022-03-12 DIAGNOSIS — M85852 Other specified disorders of bone density and structure, left thigh: Secondary | ICD-10-CM | POA: Diagnosis not present

## 2022-03-12 DIAGNOSIS — E559 Vitamin D deficiency, unspecified: Secondary | ICD-10-CM | POA: Diagnosis not present

## 2022-03-12 DIAGNOSIS — E890 Postprocedural hypothyroidism: Secondary | ICD-10-CM | POA: Diagnosis not present

## 2022-07-11 DIAGNOSIS — I10 Essential (primary) hypertension: Secondary | ICD-10-CM | POA: Diagnosis not present

## 2022-07-11 DIAGNOSIS — N390 Urinary tract infection, site not specified: Secondary | ICD-10-CM | POA: Diagnosis not present

## 2022-07-11 DIAGNOSIS — R300 Dysuria: Secondary | ICD-10-CM | POA: Diagnosis not present

## 2022-07-23 DIAGNOSIS — F411 Generalized anxiety disorder: Secondary | ICD-10-CM | POA: Diagnosis not present

## 2022-07-23 DIAGNOSIS — E785 Hyperlipidemia, unspecified: Secondary | ICD-10-CM | POA: Diagnosis not present

## 2022-07-23 DIAGNOSIS — I10 Essential (primary) hypertension: Secondary | ICD-10-CM | POA: Diagnosis not present

## 2023-04-22 DIAGNOSIS — E890 Postprocedural hypothyroidism: Secondary | ICD-10-CM | POA: Diagnosis not present

## 2023-04-22 DIAGNOSIS — F411 Generalized anxiety disorder: Secondary | ICD-10-CM | POA: Diagnosis not present

## 2023-04-22 DIAGNOSIS — E785 Hyperlipidemia, unspecified: Secondary | ICD-10-CM | POA: Diagnosis not present

## 2023-04-22 DIAGNOSIS — Z720 Tobacco use: Secondary | ICD-10-CM | POA: Diagnosis not present

## 2023-04-22 DIAGNOSIS — Z1231 Encounter for screening mammogram for malignant neoplasm of breast: Secondary | ICD-10-CM | POA: Diagnosis not present

## 2023-04-22 DIAGNOSIS — E559 Vitamin D deficiency, unspecified: Secondary | ICD-10-CM | POA: Diagnosis not present

## 2023-04-22 DIAGNOSIS — I10 Essential (primary) hypertension: Secondary | ICD-10-CM | POA: Diagnosis not present

## 2024-06-08 DIAGNOSIS — Z1231 Encounter for screening mammogram for malignant neoplasm of breast: Secondary | ICD-10-CM | POA: Diagnosis not present

## 2024-06-14 DIAGNOSIS — F411 Generalized anxiety disorder: Secondary | ICD-10-CM | POA: Diagnosis not present

## 2024-06-14 DIAGNOSIS — E559 Vitamin D deficiency, unspecified: Secondary | ICD-10-CM | POA: Diagnosis not present

## 2024-06-14 DIAGNOSIS — I10 Essential (primary) hypertension: Secondary | ICD-10-CM | POA: Diagnosis not present

## 2024-06-14 DIAGNOSIS — E785 Hyperlipidemia, unspecified: Secondary | ICD-10-CM | POA: Diagnosis not present

## 2024-06-14 DIAGNOSIS — E890 Postprocedural hypothyroidism: Secondary | ICD-10-CM | POA: Diagnosis not present

## 2024-09-20 DIAGNOSIS — Z720 Tobacco use: Secondary | ICD-10-CM | POA: Diagnosis not present

## 2024-09-20 DIAGNOSIS — E890 Postprocedural hypothyroidism: Secondary | ICD-10-CM | POA: Diagnosis not present

## 2024-09-20 DIAGNOSIS — I10 Essential (primary) hypertension: Secondary | ICD-10-CM | POA: Diagnosis not present

## 2024-09-20 DIAGNOSIS — E559 Vitamin D deficiency, unspecified: Secondary | ICD-10-CM | POA: Diagnosis not present

## 2024-09-20 DIAGNOSIS — F411 Generalized anxiety disorder: Secondary | ICD-10-CM | POA: Diagnosis not present
